# Patient Record
Sex: Female | Born: 1980 | Race: Black or African American | Hispanic: No | Marital: Married | Smoking: Never smoker
Health system: Southern US, Community
[De-identification: ages and names within clinical notes are randomized; demographics above are authoritative.]

## PROBLEM LIST (undated history)

## (undated) ENCOUNTER — Inpatient Hospital Stay (HOSPITAL_COMMUNITY): Payer: Self-pay

## (undated) DIAGNOSIS — Z789 Other specified health status: Secondary | ICD-10-CM

## (undated) HISTORY — PX: NO PAST SURGERIES: SHX2092

---

## 2017-06-09 NOTE — L&D Delivery Note (Signed)
Delivery Note At 8:23 PM, on 05/30/2018, a viable female was delivered via Vaginal, Spontaneous (Presentation: Left Occiput Anterior).  After delivery of head, patient stopped pushing resulting in delay of body delivery.  However, once efforts resumed, shoulders and body delivered easily through nuchal cord via somersault maneuver.  Infant with flaccid tone and weak grimace and cord immediately cut and infant handed off to nurse.  Code Apgar initiated and infant Apgar (6, 9). Placenta delivered spontaneously and care assumed by C. Druscilla BrownieNeill, CNM who completed repair.  Infant weight: 8lbs 6oz, 21in  Anesthesia:  Epidural Episiotomy: None Lacerations:  2nd Degree  Suture Repair: 3.0 Est. Blood Loss (mL):  150mL  Mom to postpartum.  Baby to Couplet care / Skin to Skin.  Cherre RobinsJessica L Quanda Pavlicek MSN, CNM 05/30/2018, 8:41 PM

## 2018-04-08 LAB — OB RESULTS CONSOLE GC/CHLAMYDIA
Chlamydia: NEGATIVE
Gonorrhea: NEGATIVE

## 2018-04-08 LAB — OB RESULTS CONSOLE RUBELLA ANTIBODY, IGM: Rubella: IMMUNE

## 2018-04-08 LAB — OB RESULTS CONSOLE HIV ANTIBODY (ROUTINE TESTING): HIV: NONREACTIVE

## 2018-04-08 LAB — OB RESULTS CONSOLE HEPATITIS B SURFACE ANTIGEN: Hepatitis B Surface Ag: NEGATIVE

## 2018-04-08 LAB — OB RESULTS CONSOLE ABO/RH: RH Type: POSITIVE

## 2018-04-08 LAB — OB RESULTS CONSOLE RPR: RPR: NONREACTIVE

## 2018-04-08 LAB — OB RESULTS CONSOLE ANTIBODY SCREEN: Antibody Screen: NEGATIVE

## 2018-05-03 LAB — OB RESULTS CONSOLE GC/CHLAMYDIA
Chlamydia: NEGATIVE
Gonorrhea: NEGATIVE

## 2018-05-05 LAB — OB RESULTS CONSOLE GBS: GBS: NEGATIVE

## 2018-05-10 ENCOUNTER — Inpatient Hospital Stay (HOSPITAL_COMMUNITY)
Admission: AD | Admit: 2018-05-10 | Discharge: 2018-05-10 | Disposition: A | Discharge status: Home / Self Care | Payer: Self-pay | Source: Ambulatory Visit | Attending: Obstetrics and Gynecology | Admitting: Obstetrics and Gynecology

## 2018-05-10 ENCOUNTER — Encounter (HOSPITAL_COMMUNITY): Payer: Self-pay | Admitting: *Deleted

## 2018-05-10 DIAGNOSIS — O26893 Other specified pregnancy related conditions, third trimester: Secondary | ICD-10-CM | POA: Insufficient documentation

## 2018-05-10 DIAGNOSIS — E860 Dehydration: Secondary | ICD-10-CM | POA: Insufficient documentation

## 2018-05-10 DIAGNOSIS — O471 False labor at or after 37 completed weeks of gestation: Secondary | ICD-10-CM | POA: Insufficient documentation

## 2018-05-10 DIAGNOSIS — Z3A38 38 weeks gestation of pregnancy: Secondary | ICD-10-CM | POA: Insufficient documentation

## 2018-05-10 HISTORY — DX: Other specified health status: Z78.9

## 2018-05-10 LAB — URINALYSIS, ROUTINE W REFLEX MICROSCOPIC
Bacteria, UA: NONE SEEN
Glucose, UA: NEGATIVE mg/dL
Hgb urine dipstick: NEGATIVE
Ketones, ur: 80 mg/dL — AB
Leukocytes, UA: NEGATIVE
Nitrite: NEGATIVE
Protein, ur: 100 mg/dL — AB
Specific Gravity, Urine: 1.027 (ref 1.005–1.030)
pH: 5 (ref 5.0–8.0)

## 2018-05-10 MED ORDER — ONDANSETRON 4 MG PO TBDP
4.0000 mg | ORAL_TABLET | Freq: Once | ORAL | Status: AC
  Administered 2018-05-10: 4 mg via ORAL
  Filled 2018-05-10: qty 1

## 2018-05-10 MED ORDER — ONDANSETRON HCL 4 MG PO TABS: 4.0000 mg | ORAL_TABLET | Freq: Three times a day (TID) | ORAL | 0 refills | Status: DC | PRN

## 2018-05-10 NOTE — MAU Note (Signed)
I have communicated with Dr. Aneta MinsPhillip & Dr. Gerri LinsPeiffer and reviewed vital signs:  Vitals:   05/10/18 1105 05/10/18 1523  BP: 114/83 116/71  Pulse: 92 (!) 102  Resp:    Temp:      Vaginal exam:  Dilation: 3 Effacement (%): 40 Cervical Position: (Left) Station: -2 Presentation: Vertex Exam by:: Ginnie Smartachel Dannilynn Gallina RN,   Also reviewed contraction pattern and that non-stress test is reactive.  It has been documented that patient is contracting every 3-5 minutes with minimal cervical change over 3 hours not indicating active labor.  Patient denies any other complaints.  Based on this report provider has given order for discharge.  A discharge order and diagnosis entered by a provider.   Labor discharge instructions reviewed with patient.     Pt also given instructions that if her n/v doesn't improve with the Zofran to return for IV fluids.

## 2018-05-10 NOTE — MAU Note (Signed)
Pt has been having ctx every 3-5 min. +FM, no LOF or bleeding.

## 2018-05-25 ENCOUNTER — Telehealth (HOSPITAL_COMMUNITY): Payer: Self-pay | Admitting: *Deleted

## 2018-05-25 NOTE — Telephone Encounter (Signed)
Preadmission screen  

## 2018-05-26 ENCOUNTER — Other Ambulatory Visit: Payer: Self-pay | Admitting: Advanced Practice Midwife

## 2018-05-30 ENCOUNTER — Inpatient Hospital Stay (HOSPITAL_COMMUNITY)
Admission: RE | Admit: 2018-05-30 | Discharge: 2018-06-01 | DRG: 807 | Disposition: A | Discharge status: Home / Self Care | Payer: Self-pay | Attending: Obstetrics & Gynecology | Admitting: Obstetrics & Gynecology

## 2018-05-30 ENCOUNTER — Encounter (HOSPITAL_COMMUNITY): Payer: Self-pay

## 2018-05-30 ENCOUNTER — Other Ambulatory Visit: Payer: Self-pay

## 2018-05-30 DIAGNOSIS — Z789 Other specified health status: Secondary | ICD-10-CM | POA: Diagnosis present

## 2018-05-30 DIAGNOSIS — O480 Post-term pregnancy: Principal | ICD-10-CM | POA: Diagnosis present

## 2018-05-30 DIAGNOSIS — Z3A40 40 weeks gestation of pregnancy: Secondary | ICD-10-CM

## 2018-05-30 DIAGNOSIS — Z2839 Other underimmunization status: Secondary | ICD-10-CM

## 2018-05-30 DIAGNOSIS — O09899 Supervision of other high risk pregnancies, unspecified trimester: Secondary | ICD-10-CM

## 2018-05-30 DIAGNOSIS — Z283 Underimmunization status: Secondary | ICD-10-CM

## 2018-05-30 DIAGNOSIS — O403XX0 Polyhydramnios, third trimester, not applicable or unspecified: Secondary | ICD-10-CM | POA: Diagnosis present

## 2018-05-30 DIAGNOSIS — O0930 Supervision of pregnancy with insufficient antenatal care, unspecified trimester: Secondary | ICD-10-CM

## 2018-05-30 DIAGNOSIS — Z3490 Encounter for supervision of normal pregnancy, unspecified, unspecified trimester: Secondary | ICD-10-CM | POA: Diagnosis present

## 2018-05-30 LAB — CBC
HCT: 33.7 % — ABNORMAL LOW (ref 36.0–46.0)
Hemoglobin: 11.3 g/dL — ABNORMAL LOW (ref 12.0–15.0)
MCH: 33.1 pg (ref 26.0–34.0)
MCHC: 33.5 g/dL (ref 30.0–36.0)
MCV: 98.8 fL (ref 80.0–100.0)
Platelets: 144 10*3/uL — ABNORMAL LOW (ref 150–400)
RBC: 3.41 MIL/uL — ABNORMAL LOW (ref 3.87–5.11)
RDW: 12.4 % (ref 11.5–15.5)
WBC: 4 10*3/uL (ref 4.0–10.5)
nRBC: 0 % (ref 0.0–0.2)

## 2018-05-30 LAB — COMPREHENSIVE METABOLIC PANEL
ALK PHOS: 116 U/L (ref 38–126)
ALT: 9 U/L (ref 0–44)
AST: 17 U/L (ref 15–41)
Albumin: 2.9 g/dL — ABNORMAL LOW (ref 3.5–5.0)
Anion gap: 10 (ref 5–15)
BUN: 8 mg/dL (ref 6–20)
CALCIUM: 8.8 mg/dL — AB (ref 8.9–10.3)
CO2: 18 mmol/L — ABNORMAL LOW (ref 22–32)
Chloride: 109 mmol/L (ref 98–111)
Creatinine, Ser: 0.6 mg/dL (ref 0.44–1.00)
GFR calc Af Amer: >60 mL/min (ref >60)
GFR calc non Af Amer: >60 mL/min (ref >60)
Glucose, Bld: 99 mg/dL (ref 70–99)
Potassium: 3.4 mmol/L — ABNORMAL LOW (ref 3.5–5.1)
Sodium: 137 mmol/L (ref 135–145)
Total Bilirubin: 1.8 mg/dL — ABNORMAL HIGH (ref 0.3–1.2)
Total Protein: 6.1 g/dL — ABNORMAL LOW (ref 6.5–8.1)

## 2018-05-30 LAB — ABO/RH: ABO/RH(D): B POS

## 2018-05-30 LAB — TYPE AND SCREEN
ABO/RH(D): B POS
Antibody Screen: NEGATIVE

## 2018-05-30 MED ORDER — LACTATED RINGERS IV SOLN: 500.0000 mL | INTRAVENOUS | Status: DC | PRN

## 2018-05-30 MED ORDER — OXYTOCIN BOLUS FROM INFUSION
500.0000 mL | Freq: Once | INTRAVENOUS | Status: AC
  Administered 2018-05-30: 500 mL via INTRAVENOUS

## 2018-05-30 MED ORDER — OXYCODONE-ACETAMINOPHEN 5-325 MG PO TABS: 1.0000 | ORAL_TABLET | ORAL | Status: DC | PRN

## 2018-05-30 MED ORDER — TERBUTALINE SULFATE 1 MG/ML IJ SOLN
0.2500 mg | Freq: Once | INTRAMUSCULAR | Status: DC | PRN
  Filled 2018-05-30: qty 1

## 2018-05-30 MED ORDER — SOD CITRATE-CITRIC ACID 500-334 MG/5ML PO SOLN: 30.0000 mL | ORAL | Status: DC | PRN

## 2018-05-30 MED ORDER — VITAMIN K1 1 MG/0.5ML IJ SOLN
INTRAMUSCULAR | Status: AC
  Filled 2018-05-30: qty 0.5

## 2018-05-30 MED ORDER — OXYTOCIN 40 UNITS IN LACTATED RINGERS INFUSION - SIMPLE MED
1.0000 m[IU]/min | INTRAVENOUS | Status: DC
  Administered 2018-05-30: 2 m[IU]/min via INTRAVENOUS
  Filled 2018-05-30: qty 1000

## 2018-05-30 MED ORDER — LABETALOL HCL 5 MG/ML IV SOLN: 40.0000 mg | INTRAVENOUS | Status: DC | PRN

## 2018-05-30 MED ORDER — HYDRALAZINE HCL 20 MG/ML IJ SOLN: 10.0000 mg | INTRAMUSCULAR | Status: DC | PRN

## 2018-05-30 MED ORDER — LABETALOL HCL 5 MG/ML IV SOLN: 20.0000 mg | INTRAVENOUS | Status: DC | PRN

## 2018-05-30 MED ORDER — ACETAMINOPHEN 325 MG PO TABS: 650.0000 mg | ORAL_TABLET | ORAL | Status: DC | PRN

## 2018-05-30 MED ORDER — FENTANYL CITRATE (PF) 100 MCG/2ML IJ SOLN: 100.0000 ug | INTRAMUSCULAR | Status: DC | PRN

## 2018-05-30 MED ORDER — ONDANSETRON HCL 4 MG/2ML IJ SOLN: 4.0000 mg | Freq: Four times a day (QID) | INTRAMUSCULAR | Status: DC | PRN

## 2018-05-30 MED ORDER — LACTATED RINGERS IV SOLN
INTRAVENOUS | Status: DC
  Administered 2018-05-30 (×2): via INTRAVENOUS

## 2018-05-30 MED ORDER — OXYTOCIN 40 UNITS IN LACTATED RINGERS INFUSION - SIMPLE MED: 2.5000 [IU]/h | INTRAVENOUS | Status: DC

## 2018-05-30 MED ORDER — OXYCODONE-ACETAMINOPHEN 5-325 MG PO TABS: 2.0000 | ORAL_TABLET | ORAL | Status: DC | PRN

## 2018-05-30 MED ORDER — LIDOCAINE HCL (PF) 1 % IJ SOLN
30.0000 mL | INTRAMUSCULAR | Status: DC | PRN
  Administered 2018-05-30: 30 mL via SUBCUTANEOUS
  Filled 2018-05-30: qty 30

## 2018-05-30 MED ORDER — LABETALOL HCL 5 MG/ML IV SOLN: 80.0000 mg | INTRAVENOUS | Status: DC | PRN

## 2018-05-30 NOTE — H&P (Signed)
Beth Gill is a 37 y.o. female, [id number] at 40.6 weeks, presenting for Postdates IOL. Patient pregnancy and medical history significant for Language Barrier ([place]), Incompetent cervix (Delivery at 16wks), Mild Polyhydramnios, Insufficient PNC, Varicella NI, and Recent immigration from [place] (October 2019). Patient is anticipating the delivery of a female child and plans to breastfeed.  She is unsure of birth control methods after delivery.    Patient Active Problem List   Diagnosis Date Noted  . Post term pregnancy over 40 weeks 05/30/2018    History of present pregnancy: Patient entered care at 32.4 weeks.   EDC of 05/24/2018 was established by 33wk US on Apr 02, 2018.   Anatomy scan:  33 weeks, with normal findings and an anterior placenta.   Additional US evaluations:  *Limited RVOT views that were found to be normal on subsequent studies.  *Mild Polyhydramnios at 36.5wks with AFI of 21cm  OB History    Gravida  4   Para  2   Term  2   Preterm      AB  1   Living  2     SAB  1   TAB      Ectopic      Multiple      Live Births  2          Past Medical History:  Diagnosis Date  . Medical history non-contributory    Past Surgical History:  Procedure Laterality Date  . NO PAST SURGERIES     Family History: family history is not on file. Social History:  reports that she has never smoked. She has never used smokeless tobacco. No history on file for alcohol and drug.   Prenatal Transfer Tool  Maternal Diabetes: No Genetic Screening:None d/t Late PNC Maternal Ultrasounds/Referrals: Normal Fetal Ultrasounds or other Referrals:  None Maternal Substance Abuse:  No Significant Maternal Medications:  Meds include: Other:  Significant Maternal Lab Results: Lab values include: Group B Strep negative  ROS:  +Contractions, -LOF, -VB No N/V or recent illness/fever. +Constipation -All other systems negative upon review.   No Known  Allergies  Fetal Exam: Presentation: Cephalic by BS US  FHR: 130 bpm, Mod Var, -Decels, +Accels UCs:  Q1-115min, palpates moderate  Dilation: 3 Effacement (%): 70 Station: Ballotable Exam by:: Boneta LucksJenny, RN Blood pressure 125/85, pulse 83, resp. rate 16, height 5\' 5"  (1.651 m), weight 74.5 kg, SpO2 99 %.    Physical Exam  Constitutional: She is oriented to person, place, and time. She appears well-developed and well-nourished. No distress.  HENT:  Head: Normocephalic and atraumatic.  Eyes: Pupils are equal, round, and reactive to light.  Neck: Normal range of motion.  Cardiovascular: Normal rate, regular rhythm and normal heart sounds.  Respiratory: Effort normal and breath sounds normal.  GI: Soft.  Appears AGA   Genitourinary:    Genitourinary Comments: VE Deferred by provider.    Musculoskeletal: Normal range of motion.        General: No edema.  Neurological: She is alert and oriented to person, place, and time.  Skin: Skin is warm and dry.  Psychiatric: She has a normal mood and affect. Her behavior is normal.     Prenatal labs: ABO, Rh: --/--/B POS (12/22 0912) Antibody: PENDING (12/22 0912) Rubella:  Immune RPR: Nonreactive (10/31 0000)  HBsAg: Negative (10/31 0000)  HIV: Non-reactive (10/31 0000)  GBS: Negative (11/27 0000) Sickle cell/Hgb electrophoresis:  Normal Pap:  Normal 04/08/2018 GC:  Normal Chlamydia:  Normal  Other:  Varicella Non-Immune    Assessment IUP at 40.6wks Cat I FT PostDates-IOL GBS Negative Language Barrier    Plan: Routine Induction of Labor Orders per Protocol In room to complete assessment and discuss POC: -Discussed induction methods including cervical ripening agents, foley bulbs, and pitocin -Patient verbalizes understanding and wishes to proceed with induction process. -Will start with pitocin as ordered -Interpretations completed with the assistance of family member as language line via I-Pad not functioning.   Joellyn QuailsJessica  L EmlyCNM, MSN 05/30/2018, 10:36 AM

## 2018-05-30 NOTE — Discharge Summary (Addendum)
Postpartum Discharge Summary     Patient Name: Beth Gill DOB: 08-Oct-1980 MRN: [id number]  Date of admission: 05/30/2018 Delivering Provider: Gerrit HeckEMLY, Bartosz Luginbill   Date of discharge: 06/01/2018  Admitting diagnosis: 40 WKS INDUCTION Intrauterine pregnancy: 5346w6d     Secondary diagnosis:  Principal Problem:   Encounter for induction of labor Active Problems:   Post term pregnancy over 40 weeks   Language barrier   Maternal varicella, non-immune   Insufficient prenatal care   SVD (spontaneous vaginal delivery)   Second degree laceration of perineum, delivered, current hospitalization  Additional problems: None     Discharge diagnosis: Term Pregnancy Delivered                                                                                                Post partum procedures:None   Induction: AROM and Pitocin  Complications: None  Hospital course:  Induction of Labor With Vaginal Delivery   37 y.o. yo [id number] at 6546w6d was admitted to the hospital 05/30/2018 for induction of labor.  Indication for induction: Postdates.  Patient had an uncomplicated labor course as follows: Membrane Rupture Time/Date: 5:14 PM ,05/30/2018   Intrapartum Procedures: Episiotomy: None [1]                                         Lacerations:  2nd degree [3]  Patient had delivery of a Viable infant.  Information for the patient's newborn:  Edward JollyRegtoumda, Boy Rilea [[id number]][[id number]]  Delivery Method: Vaginal, Spontaneous(Filed from Delivery Summary)   05/30/2018  Details of delivery can be found in separate delivery note.  Patient had a routine postpartum course. Patient is discharged home 06/01/18.  Magnesium Sulfate recieved: No BMZ received: No  Physical exam  Vitals:   05/31/18 0804 05/31/18 1300 05/31/18 1402 06/01/18 0616  BP: 124/73 117/74 131/84 137/87  Pulse: 69 71 69 63  Resp: 18 20 18 18   Temp: 98.4 F (36.9 C) 98.7 F (37.1 C) 98.3 F (36.8 C) 98.5 F (36.9 C)  TempSrc: Oral    Oral  SpO2: 94% 100%    Weight:      Height:       General: alert, cooperative and no distress  Chest: Lungs CTA, Heart RRR Abdomen: Soft, Appropriately Tender, Non/Distended Breast: Soft, Lactating, Nipples Intact; Assisted with infant latch Lochia: appropriate Uterine Fundus: firm at umbilicus Incision: N/A DVT Evaluation: No evidence of DVT seen on physical exam. No significant calf/ankle edema. Labs: Lab Results  Component Value Date   WBC 4.0 05/30/2018   HGB 11.3 (L) 05/30/2018   HCT 33.7 (L) 05/30/2018   MCV 98.8 05/30/2018   PLT 144 (L) 05/30/2018   No flowsheet data found.  Discharge instruction: per After Visit Summary and "Baby and Me Booklet". Pain Management, Peri-Care, Breastfeeding, Who and When to call for postpartum complications. Information Sheet(s) given Circumcision    After visit meds:  Allergies as of 06/01/2018   No Known Allergies     Medication List    TAKE  these medications   ibuprofen 600 MG tablet Commonly known as:  ADVIL,MOTRIN Take 1 tablet (600 mg total) by mouth every 6 (six) hours as needed for mild pain or moderate pain.       Diet: routine diet  Activity: Advance as tolerated. Pelvic rest for 6 weeks.   Outpatient follow up:4 weeks Follow up Appt:No future appointments. Follow up Visit:   Please schedule this patient for Postpartum visit in: 4 weeks with the following provider: [facility] For [facility] patients schedule nurse incision check in weeks 2 weeks: N/A Low risk pregnancy complicated by: Insufficient PNC, H/O Incompetent Cervix Delivery mode:  SVD Anticipated Birth Control:  Condoms PP Procedures needed: None  Schedule Integrated BH visit: no      Newborn Data: Live born female-Abdoul Hamid Birth Weight: 8lbs 6oz   APGAR:6 ,9   Newborn Delivery   Birth date/time:  05/30/2018 20:23:00 Delivery type:  Vaginal, Spontaneous     Baby Feeding: Breast Disposition:home with  mother   06/01/2018 Cherre RobinsJessica L Phinehas Grounds, CNM

## 2018-05-30 NOTE — Progress Notes (Signed)
Beth Gill MRN: [id number]  Subjective: -In room to assess.  Patient resting in bed and reports that she is coping well with contractions.    Objective: BP 133/84   Pulse 65   Temp 98.5 F (36.9 C) (Oral)   Resp 16   Ht 5\' 5"  (1.651 m)   Wt 74.5 kg   SpO2 99%   BMI 27.32 kg/m  No intake/output data recorded. No intake/output data recorded.  Fetal Monitoring: FHT: 135 bpm, Mod Var, -Decels, +Accels UC:  Q1-555min, palpates moderate to strong   Vaginal Exam: SVE:   Dilation: 5 Effacement (%): 70 Station: 0 Exam by:: Beth Gill,CNM Membranes:AROM w/MSF Noted Internal Monitors: None  Augmentation/Induction: Pitocin:7614mUn/min Cytotec: None  Assessment:  IUP at 40.6wks Cat I FT  Amniotomy IOL-PD   Plan: -Discussed AROM r/b including increased risk of infection, cord prolapse, fetal intolerance, and decreased labor time. No questions or concerns and patient desires to proceed with AROM.  -Will continue present management. -Anticipating SVD   Valma CavaJessica L Isak Sotomayor,MSN, CNM 05/30/2018, 5:34 PM

## 2018-05-30 NOTE — Progress Notes (Signed)
Beth Gill MRN: [id number]  Subjective: -In room to assess.  Patient sleeping and provider did not disturb.  Sister remains at bedside.   Objective: BP 118/72   Pulse 76   Temp 98.8 F (37.1 C) (Oral)   Resp 16   Ht 5\' 5"  (1.651 m)   Wt 74.5 kg   SpO2 99%   BMI 27.32 kg/m  No intake/output data recorded. No intake/output data recorded.  Fetal Monitoring: FHT: 135 bpm, Mod Var, -Decels, +Accels UC:  Q1-95min, palpates moderate to strong   Vaginal Exam: SVE:  Deferred Membranes:Intact Internal Monitors: None  Augmentation/Induction: Pitocin: 5510mUn/min Cytotec: None  Assessment:  IUP at 40.6wks Cat I FT  PD IOL   Plan: -Continue pitocin titration as appropriate. -Will return to assess patient comfort at later time.  -Continue other mgmt as ordered.   Beth CopaJessica L Kortlynn Poust,MSN, CNM 05/30/2018, 1:31 PM

## 2018-05-31 LAB — RPR: RPR Ser Ql: NONREACTIVE

## 2018-05-31 MED ORDER — BENZOCAINE-MENTHOL 20-0.5 % EX AERO
1.0000 "application " | INHALATION_SPRAY | CUTANEOUS | Status: DC | PRN
  Administered 2018-05-31: 1 via TOPICAL
  Filled 2018-05-31: qty 56

## 2018-05-31 MED ORDER — ONDANSETRON HCL 4 MG PO TABS: 4.0000 mg | ORAL_TABLET | ORAL | Status: DC | PRN

## 2018-05-31 MED ORDER — SIMETHICONE 80 MG PO CHEW: 80.0000 mg | CHEWABLE_TABLET | ORAL | Status: DC | PRN

## 2018-05-31 MED ORDER — TETANUS-DIPHTH-ACELL PERTUSSIS 5-2.5-18.5 LF-MCG/0.5 IM SUSP: 0.5000 mL | Freq: Once | INTRAMUSCULAR | Status: AC

## 2018-05-31 MED ORDER — COCONUT OIL OIL: 1.0000 "application " | TOPICAL_OIL | Status: DC | PRN

## 2018-05-31 MED ORDER — ACETAMINOPHEN 325 MG PO TABS
650.0000 mg | ORAL_TABLET | ORAL | Status: DC | PRN
  Administered 2018-05-31 – 2018-06-01 (×3): 650 mg via ORAL
  Filled 2018-05-31 (×3): qty 2

## 2018-05-31 MED ORDER — SENNOSIDES-DOCUSATE SODIUM 8.6-50 MG PO TABS
2.0000 | ORAL_TABLET | ORAL | Status: DC
  Administered 2018-05-31 (×2): 2 via ORAL
  Filled 2018-05-31 (×2): qty 2

## 2018-05-31 MED ORDER — WITCH HAZEL-GLYCERIN EX PADS: 1.0000 "application " | MEDICATED_PAD | CUTANEOUS | Status: DC | PRN

## 2018-05-31 MED ORDER — DIPHENHYDRAMINE HCL 25 MG PO CAPS: 25.0000 mg | ORAL_CAPSULE | Freq: Four times a day (QID) | ORAL | Status: DC | PRN

## 2018-05-31 MED ORDER — ONDANSETRON HCL 4 MG/2ML IJ SOLN: 4.0000 mg | INTRAMUSCULAR | Status: DC | PRN

## 2018-05-31 MED ORDER — PRENATAL MULTIVITAMIN CH
1.0000 | ORAL_TABLET | Freq: Every day | ORAL | Status: DC
  Administered 2018-05-31 – 2018-06-01 (×2): 1 via ORAL
  Filled 2018-05-31 (×2): qty 1

## 2018-05-31 MED ORDER — ZOLPIDEM TARTRATE 5 MG PO TABS: 5.0000 mg | ORAL_TABLET | Freq: Every evening | ORAL | Status: DC | PRN

## 2018-05-31 MED ORDER — IBUPROFEN 600 MG PO TABS
600.0000 mg | ORAL_TABLET | Freq: Four times a day (QID) | ORAL | Status: DC
  Administered 2018-05-31 – 2018-06-01 (×7): 600 mg via ORAL
  Filled 2018-05-31 (×6): qty 1

## 2018-05-31 MED ORDER — DIBUCAINE 1 % RE OINT: 1.0000 "application " | TOPICAL_OINTMENT | RECTAL | Status: DC | PRN

## 2018-05-31 NOTE — Progress Notes (Signed)
POSTPARTUM PROGRESS NOTE  Subjective: Phone interpreter was used for postpartum rounds.  Beth Gill is a 37 y.o. [id number] 7970w6d s/p SVD.  No acute events overnight.  Pt denies problems with ambulating, voiding or po intake.  She denies nausea or vomiting.  Pain is well controlled.  She has had flatus. She has not had bowel movement.  Lochia Moderate.   Objective: Blood pressure 124/73, pulse 69, temperature 98.4 F (36.9 C), temperature source Oral, resp. rate 18, height 5\' 5"  (1.651 m), weight 74.5 kg, SpO2 94 %, unknown if currently breastfeeding.  Physical Exam:  General: alert, sitting in bed, NAD Lochia:normal flow Chest: no respiratory distress Heart:regular rate, distal pulses intact Incision: NA  Abdomen: soft, nontender,  Uterine Fundus: firm, appropriately tender DVT Evaluation: No calf swelling or tenderness Extremities: no pedal edema  Recent Labs    05/30/18 0912  HGB 11.3*  HCT 33.7*    Assessment/Plan:  ASSESSMENT: Beth Gill is a 37 y.o. [id number] 1570w6d s/p SVD. -doing well today, no concerns -breastfeeding, denies difficulty or questions at this time -did have some elevated pressures during delivery, PIH labs negative. BP wnl today -RPR pending -unsure about postpartum contraception at this time  Plan for discharge tomorrow   LOS: 1 day   Jayelyn Barno H WilsonMD 05/31/2018, 9:17 AM

## 2018-05-31 NOTE — Lactation Note (Signed)
This note was copied from a baby's chart. Lactation Consultation Note  Patient Name: Boy Gloris HamRoukieta Palladino XBJYN'WToday's Date: 05/31/2018 Reason for consult: Initial assessment;Term Video interpreter used for visit.  Breastfeeding consultation services and support information given to patient.  Mom is experienced breastfeeding her previous babies for 2 years.  She chooses to breastfeed and formula feed.  Discussed importance of putting baby to breast first with any feeding cues.  Mom reports that baby is latching without difficulty.  Questions answered. Encouraged to call for assist/concerns prn.  Maternal Data Does the patient have breastfeeding experience prior to this delivery?: Yes  Feeding Feeding Type: Breast Fed  LATCH Score Latch: Repeated attempts needed to sustain latch, nipple held in mouth throughout feeding, stimulation needed to elicit sucking reflex.  Audible Swallowing: A few with stimulation  Type of Nipple: Everted at rest and after stimulation  Comfort (Breast/Nipple): Soft / non-tender  Hold (Positioning): No assistance needed to correctly position infant at breast.  LATCH Score: 8  Interventions    Lactation Tools Discussed/Used     Consult Status Consult Status: Follow-up Date: 06/01/18 Follow-up type: In-patient    Huston FoleyMOULDEN, Jesenya Bowditch S 05/31/2018, 10:53 AM

## 2018-05-31 NOTE — Progress Notes (Signed)
Reviewed baby safety sheet, and safe sleep information with MOB using Interpreter [phone](#[phone]). During this time patient requested to give baby formula via bottle, proceeded to do LEAD education with pt via Interpreter.        Parent request formula to supplement breast feeding due to mother's choice, she stated she wanted to be breast bottle during admission. Parents have been informed of small tummy size of newborn, taught hand expression and understands the possible consequences of formula to the health of the infant. The possible consequences shared with patent include 1) Loss of confidence in breastfeeding 2) Engorgement 3) Allergic sensitization of baby(asthema/allergies) and 4) decreased milk supply for mother. After discussion of the above the mother decided to breast feed and supplement with formula. The  tool used to give formula supplement will be bottle.

## 2018-06-01 MED ORDER — IBUPROFEN 600 MG PO TABS: 600.0000 mg | ORAL_TABLET | Freq: Four times a day (QID) | ORAL | 1 refills | Status: DC | PRN

## 2018-06-01 NOTE — Lactation Note (Addendum)
This note was copied from a baby's chart. Lactation Consultation Note  Patient Name: Boy Gloris HamRoukieta Yellin BJYNW'GToday's Date: 06/01/2018   P3, Baby 36 hours old.  Video interpreter used for [place] [phone]. Mother is breastfeeding and formula feeding. Encouraged breastfeeding before offering formula to help her establish her milk supply. Discussed supply and demand. Feed on demand approximately 8-12 times per day.   Reviewed engorgement care and monitoring voids/stools.      Maternal Data    Feeding Feeding Type: Bottle Fed - Formula Nipple Type: Slow - flow  LATCH Score                   Interventions    Lactation Tools Discussed/Used     Consult Status      Hardie PulleyBerkelhammer, Ruth Boschen 06/01/2018, 9:11 AM

## 2018-06-01 NOTE — Progress Notes (Signed)
CLINICAL SOCIAL WORK MATERNAL/CHILD NOTE  Patient Details  Name: Beth Gill MRN: [id number] Date of Birth: 05/30/2016  Date:  06/01/2018  Clinical Social Worker Initiating Note:  Abundio Miu, [place]   Date/Time: Initiated:  06/01/18/1114             Child's Name:  Beth Gill   Biological Parents:  Mother, Father(Father - Faustino Congress)   Need for Interpreter:  French(Video Interpreter Hildred Priest [phone])   Reason for Referral:  Late or No Prenatal Care    Address:  [address] [place] [address]    Phone number:  [phone] (home)     Additional phone number:   Household Members/Support Persons (HM/SP):   Household Member/Support Person 1, Household Member/Support Person 2   HM/SP Name Relationship DOB or Age  HM/SP -1  sister   HM/SP -2  brother in Sports coach   HM/SP -3     HM/SP -4     HM/SP -37     HM/SP -6     HM/SP -7     HM/SP -8       Natural Supports (not living in the home): Immediate Family   Professional Supports:    Employment:Unemployed   Type of Work:     Education:  Programmer, systems   Homebound arranged:    Financial Resources:Self-Pay    Other Resources: [facility]   Cultural/Religious Considerations Which May Impact Care:   Strengths: Ability to meet basic needs , Pediatrician chosen   Psychotropic Medications:         Pediatrician:    [place] area  Pediatrician List:   [place] Other([facility] [place])  [place]     Pediatrician Fax Number:    Risk Factors/Current Problems: Other (Comment)(language barrier)   Cognitive State: Able to Concentrate , Alert , Linear Thinking    Mood/Affect: Calm , Relaxed , Interested    CSW Assessment:CSW met with MOB at bedside to discuss late prenatal care. CSW utilized [employer]  interpreting to speak to translate. MOB was welcoming and engaged during assessment. CSW introduced self and explained role. MOB reported that she resides with her sister and brother in law. MOB reported that FOB and her 2 other children (Nouria Kolgol;; [place]) are in [place], noting that they are not planning to move to [place] right now. MOB reported that it is her intention to stay in [place] and reported that she was here in 2014. MOB reported that she is not currently working and that her sister is her support person. MOB reported that her sister is currently at home getting things organized for the baby. CSW inquired if MOB had all needed items for the baby, MOB reported that she will find out when she gets home. CSW asked MOB if she is able to get any items that baby needs if they are not at the home, MOB reported that her sister can get the items. CSW asked MOB if her car seat was new/used and if she checked the expiration. MOB reported that she is unaware because her sister is bringing the car seat. MOB asked if the car seat is expired can the hospital provide a car seat, CSW informed MOB that she could get a one time car seat and that she will have to pay for it. MOB reported that her sister can  pay for it if needed and asked who will she need to ask for it if needed, CSW explained that MOB's RN could help her get the car seat.   CSW inquired about MOB's mental health history, MOB denied any mental health history and any history of postpartum depression with two older children. MOB presented calm and did not demonstrate any acute mental health signs/symptoms. CSW assessed for safety, MOB denied SI, HI and domestic violence.   CSW provided education regarding the baby blues period vs. perinatal mood disorders, discussed treatment and gave resources for mental health follow up if concerns arise.  CSW recommends self-evaluation during the postpartum time period using the New Mom Checklist  from Postpartum Progress and encouraged MOB to contact a medical professional if symptoms are noted at any time.    CSW provided review of Sudden Infant Death Syndrome (SIDS) precautions. MOB reported that infant would sleep with her. CSW informed MOB that co-sleeping was not appropriate and inquired if MOB needed a baby box, MOB replied yes. CSW reiterated that co-sleeping is not appropriate, MOB verbalized understanding. CSW provided MOB with a baby box.   CSW informed MOB about hospital drug policy due to late prenatal care. CSW asked MOB if she received any prenatal care in [place], MOB reported no and did not give a reason why. CSW asked MOB if she used any substances during her pregnancy, MOB denied any substance use during pregnancy. CSW informed MOB that a CPS report would be made if infant tested positive for any illegal substances and or unprescribed substances, MOB verbalized understanding and did not seem concerned.    CSW identifies no further need for intervention and no barriers to discharge at this time.  CSW notified patient's bedside RN that patient may need a car seat.   CSW Plan/Description: No Further Intervention Required/No Barriers to Discharge, Sudden Infant Death Syndrome (SIDS) Education, Perinatal Mood and Anxiety Disorder (PMADs) Education, [place], CSW Will Continue to Monitor Umbilical Cord Tissue Drug Screen Results and Make Report if Barbette Or, LCSW 06/01/2018, 11:20 AM

## 2018-06-07 ENCOUNTER — Inpatient Hospital Stay (HOSPITAL_COMMUNITY)
Admission: AD | Admit: 2018-06-07 | Discharge: 2018-06-07 | Disposition: A | Discharge status: Home / Self Care | Payer: Self-pay | Source: Ambulatory Visit | Attending: Obstetrics & Gynecology | Admitting: Obstetrics & Gynecology

## 2018-06-07 ENCOUNTER — Encounter (HOSPITAL_COMMUNITY): Payer: Self-pay

## 2018-06-07 ENCOUNTER — Inpatient Hospital Stay (HOSPITAL_COMMUNITY): Payer: Self-pay

## 2018-06-07 DIAGNOSIS — O165 Unspecified maternal hypertension, complicating the puerperium: Secondary | ICD-10-CM | POA: Insufficient documentation

## 2018-06-07 DIAGNOSIS — Z789 Other specified health status: Secondary | ICD-10-CM | POA: Diagnosis present

## 2018-06-07 LAB — COMPREHENSIVE METABOLIC PANEL
ALT: 20 U/L (ref 0–44)
ANION GAP: 7 (ref 5–15)
AST: 19 U/L (ref 15–41)
Albumin: 3.3 g/dL — ABNORMAL LOW (ref 3.5–5.0)
Alkaline Phosphatase: 81 U/L (ref 38–126)
BUN: 13 mg/dL (ref 6–20)
CHLORIDE: 109 mmol/L (ref 98–111)
CO2: 22 mmol/L (ref 22–32)
Calcium: 8.4 mg/dL — ABNORMAL LOW (ref 8.9–10.3)
Creatinine, Ser: 0.7 mg/dL (ref 0.44–1.00)
GFR calc Af Amer: >60 mL/min (ref >60)
GFR calc non Af Amer: >60 mL/min (ref >60)
Glucose, Bld: 105 mg/dL — ABNORMAL HIGH (ref 70–99)
POTASSIUM: 3.4 mmol/L — AB (ref 3.5–5.1)
Sodium: 138 mmol/L (ref 135–145)
Total Bilirubin: 1 mg/dL (ref 0.3–1.2)
Total Protein: 6.5 g/dL (ref 6.5–8.1)

## 2018-06-07 LAB — PROTEIN / CREATININE RATIO, URINE
Creatinine, Urine: 347 mg/dL
Protein Creatinine Ratio: 0.12 mg/mg{Cre} (ref 0.00–0.15)
Total Protein, Urine: 41 mg/dL

## 2018-06-07 LAB — CBC
HCT: 34.6 % — ABNORMAL LOW (ref 36.0–46.0)
Hemoglobin: 11.4 g/dL — ABNORMAL LOW (ref 12.0–15.0)
MCH: 32.8 pg (ref 26.0–34.0)
MCHC: 32.9 g/dL (ref 30.0–36.0)
MCV: 99.4 fL (ref 80.0–100.0)
PLATELETS: 258 10*3/uL (ref 150–400)
RBC: 3.48 MIL/uL — ABNORMAL LOW (ref 3.87–5.11)
RDW: 12.3 % (ref 11.5–15.5)
WBC: 5.9 10*3/uL (ref 4.0–10.5)
nRBC: 0 % (ref 0.0–0.2)

## 2018-06-07 MED ORDER — NIFEDIPINE 10 MG PO CAPS: 20.0000 mg | ORAL_CAPSULE | ORAL | Status: DC | PRN

## 2018-06-07 MED ORDER — IBUPROFEN 600 MG PO TABS: 600.0000 mg | ORAL_TABLET | Freq: Four times a day (QID) | ORAL | 1 refills | Status: AC | PRN

## 2018-06-07 MED ORDER — IBUPROFEN 800 MG PO TABS
800.0000 mg | ORAL_TABLET | Freq: Once | ORAL | Status: AC
  Administered 2018-06-07: 800 mg via ORAL
  Filled 2018-06-07: qty 1

## 2018-06-07 MED ORDER — MISOPROSTOL 200 MCG PO TABS
800.0000 ug | ORAL_TABLET | Freq: Once | ORAL | Status: DC
  Filled 2018-06-07: qty 4

## 2018-06-07 MED ORDER — LABETALOL HCL 5 MG/ML IV SOLN: 40.0000 mg | INTRAVENOUS | Status: DC | PRN

## 2018-06-07 MED ORDER — ACETAMINOPHEN 500 MG PO TABS
1000.0000 mg | ORAL_TABLET | Freq: Once | ORAL | Status: AC
  Administered 2018-06-07: 1000 mg via ORAL
  Filled 2018-06-07: qty 2

## 2018-06-07 MED ORDER — NIFEDIPINE 10 MG PO CAPS
10.0000 mg | ORAL_CAPSULE | ORAL | Status: DC | PRN
  Administered 2018-06-07: 10 mg via ORAL
  Filled 2018-06-07: qty 1

## 2018-06-07 MED ORDER — MISOPROSTOL 200 MCG PO TABS
800.0000 ug | ORAL_TABLET | Freq: Once | ORAL | Status: AC
  Administered 2018-06-07: 800 ug via RECTAL

## 2018-06-07 MED ORDER — AMLODIPINE BESYLATE 5 MG PO TABS: 5.0000 mg | ORAL_TABLET | Freq: Every day | ORAL | 0 refills | Status: DC

## 2018-06-07 NOTE — Progress Notes (Signed)
Using interpreter # [id number] Pt s/p SVD 12/22 & states has been having cramping & passing large clots only in the morning.  Pt denies saturating a pad in hr.  Pt states cramping with activity that radiates down left leg as 6 on 0-10 pain scale; making it difficult for her to walk.  Pt states she breast & bottle feeds.  Pt states she took prescribed Motrin this am & that it's helpful, but pain returns when med wears off.  Pt states that she has h/a as 8 on 0-10 pain scale, denies visual disturbances, or epigastric pain. CNM at bedside at this time to listen to intake. PreE assessment nl.  Fundas firm noted 2 FB below umbilicus.  Pt states she changed pad before arrival to MAU & pad noted to have scant amt lochia.  POC discussed with pt re: labs, spec exam, ultrasound, & plan for Bp assessment. Pt verb understanding & has no questions at this time.

## 2018-06-07 NOTE — Discharge Instructions (Signed)
You need to pick up your prescription for Ibuprofen and Norvasc at a pharmacy. Take the Norvasc 5 mg every day. Take Ibuprofen as needed.

## 2018-06-07 NOTE — Progress Notes (Signed)
Interpreter Carney BernJean [phone]#[phone] used to educate on misoprostol, tylenol, Motrin, & procardia; given indication,action & side effects of meds. Pt verb understanding. Miso placed rectally as ordered.

## 2018-06-07 NOTE — MAU Note (Signed)
Urine in the lab  

## 2018-06-07 NOTE — MAU Provider Note (Addendum)
History     CSN: [id number]  Arrival date and time: 06/07/18 1538  Provider's first contact with patient at 1630 -- assessment and exam done with assistance from [employer] video interpreter Roddie McMadeleine [phone]     Chief Complaint  Patient presents with  . Vaginal Bleeding   HPI  Ms.  Beth Gill is a 37 y.o. year old [id number] female at 8 days PP who presents to MAU reporting lower abdominal cramping, VB, passing large blood clots every morning, the abdominal cramping radiates down LT leg, and H/A (rated 8/10). She states that she take Motrin and it takes the pain away, but the pain comes back when the medicine wears off. She does reports pain in the vaginal area where she was stitched up.  Past Medical History:  Diagnosis Date  . Medical history non-contributory     Past Surgical History:  Procedure Laterality Date  . NO PAST SURGERIES      No family history on file.  Social History   Tobacco Use  . Smoking status: Never Smoker  . Smokeless tobacco: Never Used  Substance Use Topics  . Alcohol use: Not Currently    Frequency: Never  . Drug use: Never    Allergies: No Known Allergies  Medications Prior to Admission  Medication Sig Dispense Refill Last Dose  . ibuprofen (ADVIL,MOTRIN) 600 MG tablet Take 1 tablet (600 mg total) by mouth every 6 (six) hours as needed for mild pain or moderate pain. 30 tablet 1     Review of Systems  Constitutional: Negative.   HENT: Negative.   Eyes: Negative.   Respiratory: Negative.   Cardiovascular: Negative.   Gastrointestinal: Negative.   Endocrine: Negative.   Genitourinary: Positive for vaginal bleeding ("passing large clots").  Musculoskeletal: Negative.        LT leg pain  Skin: Negative.   Allergic/Immunologic: Negative.   Neurological: Negative.   Hematological: Negative.   Psychiatric/Behavioral: Negative.    Physical Exam   Blood pressure (!) 173/106, pulse 81, temperature 98.6 F (37 C), temperature  source Oral, resp. rate 16, height 5\' 3"  (1.6 m), weight 60.3 kg, SpO2 100 %.  Physical Exam  Nursing note and vitals reviewed. Constitutional: She is oriented to person, place, and time. She appears well-developed and well-nourished.  HENT:  Head: Normocephalic and atraumatic.  Eyes: Pupils are equal, round, and reactive to light.  Neck: Normal range of motion.  Cardiovascular: Normal rate.  Respiratory: Effort normal.  GI: Soft.  Genitourinary:    Genitourinary Comments: No active bleeding, 2nd degree vaginal repair visualized, sutures intact, normal healing tissue   Musculoskeletal: Normal range of motion.  Neurological: She is alert and oriented to person, place, and time.  Skin: Skin is warm and dry.  Psychiatric: She has a normal mood and affect. Her behavior is normal. Judgment and thought content normal.    MAU Course  Procedures  MDM OB U/S to R/O Retained POC Cytotec 800 mcg rectally  *Consult with Dr. Alysia PennaErvin @ 1803 - notified of patient's complaints, assessments, lab & U/S results, tx plan give Cytotec 800 mcg rectally, recommend to Rx Norvasc 5 mg qd and recheck BP on Friday 06/11/18 - ok to d/c home, agrees with plan   Results for orders placed or performed during the hospital encounter of 06/07/18 (from the past 24 hour(s))  Protein / creatinine ratio, urine     Status: None   Collection Time: 06/07/18  4:29 PM  Result Value Ref Range   Creatinine,  Urine 347.00 mg/dL   Total Protein, Urine 41 mg/dL   Protein Creatinine Ratio 0.12 0.00 - 0.15 mg/mg[Cre]  CBC     Status: Abnormal   Collection Time: 06/07/18  4:52 PM  Result Value Ref Range   WBC 5.9 4.0 - 10.5 K/uL   RBC 3.48 (L) 3.87 - 5.11 MIL/uL   Hemoglobin 11.4 (L) 12.0 - 15.0 g/dL   HCT 14.734.6 (L) 82.936.0 - 56.246.0 %   MCV 99.4 80.0 - 100.0 fL   MCH 32.8 26.0 - 34.0 pg   MCHC 32.9 30.0 - 36.0 g/dL   RDW 13.012.3 86.511.5 - 78.415.5 %   Platelets 258 150 - 400 K/uL   nRBC 0.0 0.0 - 0.2 %  Comprehensive metabolic panel      Status: Abnormal   Collection Time: 06/07/18  4:52 PM  Result Value Ref Range   Sodium 138 135 - 145 mmol/L   Potassium 3.4 (L) 3.5 - 5.1 mmol/L   Chloride 109 98 - 111 mmol/L   CO2 22 22 - 32 mmol/L   Glucose, Bld 105 (H) 70 - 99 mg/dL   BUN 13 6 - 20 mg/dL   Creatinine, Ser 6.960.70 0.44 - 1.00 mg/dL   Calcium 8.4 (L) 8.9 - 10.3 mg/dL   Total Protein 6.5 6.5 - 8.1 g/dL   Albumin 3.3 (L) 3.5 - 5.0 g/dL   AST 19 15 - 41 U/L   ALT 20 0 - 44 U/L   Alkaline Phosphatase 81 38 - 126 U/L   Total Bilirubin 1.0 0.3 - 1.2 mg/dL   GFR calc non Af Amer >60 >60 mL/min   GFR calc Af Amer >60 >60 mL/min   Anion gap 7 5 - 15    [place] Ob Comp Less 14 Wks  Result Date: 06/07/2018 CLINICAL DATA:  Postpartum bleeding EXAM: OBSTETRIC <14 WK ULTRASOUND TECHNIQUE: Transabdominal ultrasound was performed for evaluation of the gestation as well as the maternal uterus and adnexal regions. COMPARISON:  None. FINDINGS: Intrauterine gestational sac: None visualized Yolk sac:  None Embryo:  None Cardiac Activity: Heart Rate:  bpm MSD:    mm    w     d CRL:     mm    w  d                  US EDC: Subchorionic hemorrhage:  None visualized. Maternal uterus/adnexae: Endometrium approximately 15 mm in thickness, heterogeneous. There appears to be some vascularity within the endometrium. Findings concerning for retained products of conception. IMPRESSION: Heterogeneous endometrium with areas of vascularity concerning for retained products of conception. Electronically Signed   By: Charlett NoseKevin  Dover M.D.   On: 06/07/2018 17:44    Assessment and Plan  Retained products of conception, postpartum  - Discussed results of U/S with patient - Advised that having received Cytotec, she should expect to have heavy bleeding, clots and increased pelvic painas what is left inside her uterus is expelled out  Postpartum hypertension - Rx for Norvasc 5 mg po qd - Information provided on postpartum hypertension - [facility]  [facility]11/18 -- [facility] patient to get this scheduled  - Discharge patient - Patient verbalized an understanding of the plan of care and agrees.    *Discharge instructions and all questions answered with assistance from [employer] video interpreter Liliana #[id number]  Raelyn Moraolitta Shanina Kepple, MSN, CNM 06/07/2018, 4:30 PM

## 2018-06-08 ENCOUNTER — Telehealth: Payer: Self-pay | Admitting: Family Medicine

## 2018-06-08 NOTE — Telephone Encounter (Signed)
Left a message for the patient via voicemail. The appointment is made per the request. Sending a letter reminder to the patient for secondary notification.

## 2018-06-11 ENCOUNTER — Ambulatory Visit (INDEPENDENT_AMBULATORY_CARE_PROVIDER_SITE_OTHER): Payer: Self-pay | Admitting: General Practice

## 2018-06-11 VITALS — BP 139/95 | HR 92 | Ht 64.17 in | Wt 130.0 lb

## 2018-06-11 DIAGNOSIS — O165 Unspecified maternal hypertension, complicating the puerperium: Secondary | ICD-10-CM

## 2018-06-11 MED ORDER — AMLODIPINE BESYLATE 10 MG PO TABS: 10.0000 mg | ORAL_TABLET | Freq: Every day | ORAL | 0 refills | Status: AC

## 2018-06-11 NOTE — Patient Instructions (Signed)
Postpartum Hypertension  Postpartum hypertension is high blood pressure that remains higher than normal after childbirth. You may not realize that you have postpartum hypertension if your blood pressure is not being checked regularly. In most cases, postpartum hypertension will go away on its own, usually within a week of delivery. However, for some women, medical treatment is required to prevent serious complications, such as seizures or stroke.  What are the causes?  This condition may be caused by one or more of the following:   Hypertension that existed before pregnancy (chronic hypertension).   Hypertension that comes on as a result of pregnancy (gestational hypertension).   Hypertensive disorders during pregnancy (preeclampsia) or seizures in women who have high blood pressure during pregnancy (eclampsia).   A condition in which the liver, platelets, and red blood cells are damaged during pregnancy (HELLP syndrome).   A condition in which the thyroid produces too much hormones (hyperthyroidism).   Other rare problems of the nerves (neurological disorders) or blood disorders.  In some cases, the cause may not be known.  What increases the risk?  The following factors may make you more likely to develop this condition:   Chronic hypertension. In some cases, this may not have been diagnosed before pregnancy.   Obesity.   Type 2 diabetes.   Kidney disease.   History of preeclampsia or eclampsia.   Other medical conditions that change the level of hormones in the body (hormonal imbalance).  What are the signs or symptoms?  As with all types of hypertension, postpartum hypertension may not have any symptoms. Depending on how high your blood pressure is, you may experience:   Headaches. These may be mild, moderate, or severe. They may also be steady, constant, or sudden in onset (thunderclap headache).   Changes in your ability to see (visual changes).   Dizziness.   Shortness of breath.   Swelling  of your hands, feet, lower legs, or face. In some cases, you may have swelling in more than one of these locations.   Heart palpitations or a racing heartbeat.   Difficulty breathing while lying down.   Decrease in the amount of urine that you pass.  Other rare signs and symptoms may include:   Sweating more than usual. This lasts longer than a few days after delivery.   Chest pain.   Sudden dizziness when you get up from sitting or lying down.   Seizures.   Nausea or vomiting.   Abdominal pain.  How is this diagnosed?  This condition may be diagnosed based on the results of a physical exam, blood pressure measurements, and blood and urine tests.  You may also have other tests, such as a CT scan or an MRI, to check for other problems of postpartum hypertension.  How is this treated?  If blood pressure is high enough to require treatment, your options may include:   Medicines to reduce blood pressure (antihypertensives). Tell your health care provider if you are breastfeeding or if you plan to breastfeed. There are many antihypertensive medicines that are safe to take while breastfeeding.   Stopping medicines that may be causing hypertension.   Treating medical conditions that are causing hypertension.   Treating the complications of hypertension, such as seizures, stroke, or kidney problems.  Your health care provider will also continue to monitor your blood pressure closely until it is within a safe range for you.  Follow these instructions at home:   Take over-the-counter and prescription medicines only as   told by your health care provider.   Return to your normal activities as told by your health care provider. Ask your health care provider what activities are safe for you.   Do not use any products that contain nicotine or tobacco, such as cigarettes and e-cigarettes. If you need help quitting, ask your health care provider.   Keep all follow-up visits as told by your health care provider. This  is important.  Contact a health care provider if:   Your symptoms get worse.   You have new symptoms, such as:  ? A headache that does not get better.  ? Dizziness.  ? Visual changes.  Get help right away if:   You suddenly develop swelling in your hands, ankles, or face.   You have sudden, rapid weight gain.   You develop difficulty breathing, chest pain, racing heartbeat, or heart palpitations.   You develop severe pain in your abdomen.   You have any symptoms of a stroke. "BE FAST" is an easy way to remember the main warning signs of a stroke:  ? B - Balance. Signs are dizziness, sudden trouble walking, or loss of balance.  ? E - Eyes. Signs are trouble seeing or a sudden change in vision.  ? F - Face. Signs are sudden weakness or numbness of the face, or the face or eyelid drooping on one side.  ? A - Arms. Signs are weakness or numbness in an arm. This happens suddenly and usually on one side of the body.  ? S - Speech. Signs are sudden trouble speaking, slurred speech, or trouble understanding what people say.  ? T - Time. Time to call emergency services. Write down what time symptoms started.   You have other signs of a stroke, such as:  ? A sudden, severe headache with no known cause.  ? Nausea or vomiting.  ? Seizure.  These symptoms may represent a serious problem that is an emergency. Do not wait to see if the symptoms will go away. Get medical help right away. Call your local emergency services (911 in the [place] Do not drive yourself to the hospital.  Summary   Postpartum hypertension is high blood pressure that remains higher than normal after childbirth.   In most cases, postpartum hypertension will go away on its own, usually within a week of delivery.   For some women, medical treatment is required to prevent serious complications, such as seizures or stroke.  This information is not intended to replace advice given to you by your health care provider. Make sure you discuss any questions  you have with your health care provider.  Document Released: 01/27/2014 Document Revised: 03/16/2017 Document Reviewed: 03/16/2017  Elsevier Interactive Patient Education  2019 [employer]

## 2018-06-11 NOTE — Progress Notes (Signed)
Patient presents to office today for blood pressure check following recent visit to MAU on 12/30 for retained products & elevated blood pressures. Patient reports taking norvasc daily and reports some headaches as well. Patient voices concern over light bleeding, cramping, small clots & reports a smell like "rotten fish" over the past few days.   Called Dr Nena Alexander concerning patient's symptoms and elevated BP. Dr Macon Large advises increasing Norvasc to 10mg  and states nothing else needs to be done regarding retained products unless bleeding increases to heavy or pain is severe. Discussed all with patient. Patient reports continued concern over smell & bleeding. Judeth Horn in to reassure patient. She will return in 1 week for repeat BP check.   Chase Caller RN BSN 06/11/18

## 2018-06-12 NOTE — Progress Notes (Signed)
Chart reviewed for nurse visit. Agree with plan of care.   Spoke with patient via video [place] interpreter. Discussed recommendations and s/s to return to MAU. Pt reassured and has no further questions.   Judeth Horn, NP 06/12/2018 1:01 PM

## 2018-06-18 ENCOUNTER — Ambulatory Visit: Payer: Self-pay

## 2018-06-22 ENCOUNTER — Ambulatory Visit (INDEPENDENT_AMBULATORY_CARE_PROVIDER_SITE_OTHER): Payer: Self-pay

## 2018-06-22 VITALS — BP 148/107 | HR 74 | Wt 127.3 lb

## 2018-06-22 DIAGNOSIS — Z0130 Encounter for examination of blood pressure without abnormal findings: Secondary | ICD-10-CM

## 2018-06-22 NOTE — Progress Notes (Signed)
Pt here today for BP check s/p increase in Norvasc dosage to 10 mg.  With Video Interpreter # [phone] Pt reports taking one tablet a day in the evening. Due to pt not taking medication until in the evening pt has not yet taking medication today.  Pt reports having headaches that is relieved when she takes Tylenol.  Notified Dr. Rhett Bannister, provider recommendation to have pt come back on Friday for another BP check and makes sure that the pt takes the medication before appt.  Notified pt of provider's recommendation and to start taking in the am.  Pt stated understanding with no further questions.

## 2018-06-23 NOTE — Progress Notes (Signed)
Chart reviewed for nurse visit. Agree with plan of care.   Wallace, Catherine Lauren, DO 06/23/2018 11:35 AM  

## 2018-06-25 ENCOUNTER — Ambulatory Visit: Payer: Self-pay

## 2019-08-01 IMAGING — US US OB COMP LESS 14 WK
1 series · 15 of 28 positions shown · non-contrast
Comparison: None.

CLINICAL DATA: Postpartum bleeding

EXAM:
OBSTETRIC <14 WK ULTRASOUND
TECHNIQUE: Transabdominal ultrasound was performed for evaluation of the
gestation as well as the maternal uterus and adnexal regions.

[Series 1: us ob comp less 14 wk · 36 acquisitions, 15 frames shown]
[im 1/36]
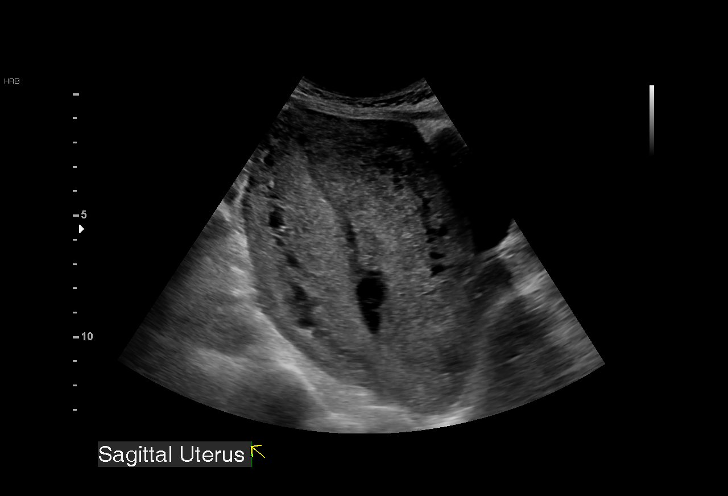
[im 3/36]
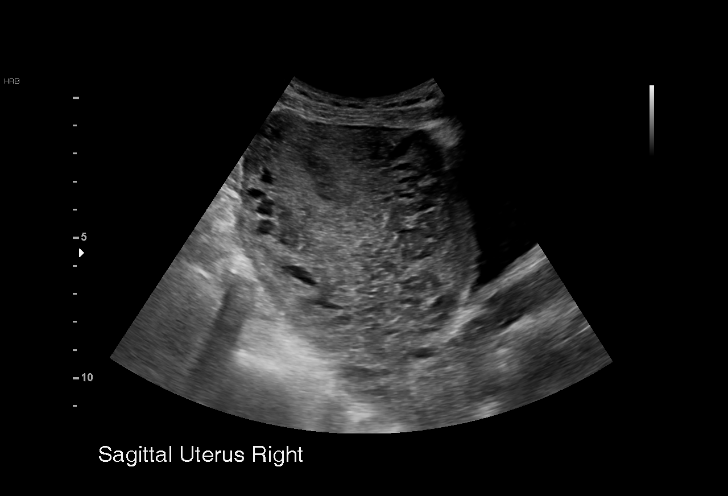
[im 6/36]
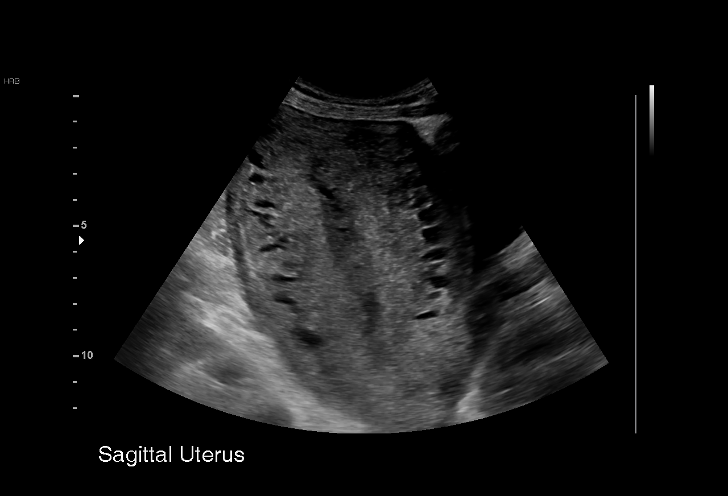
[im 8/36]
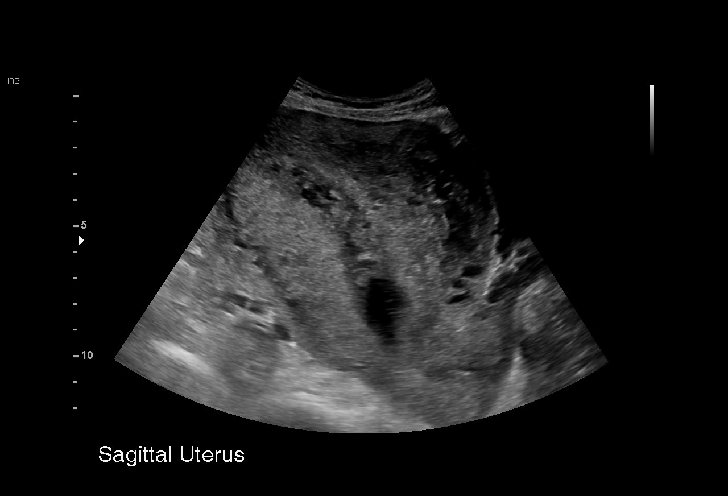
[im 11/36]
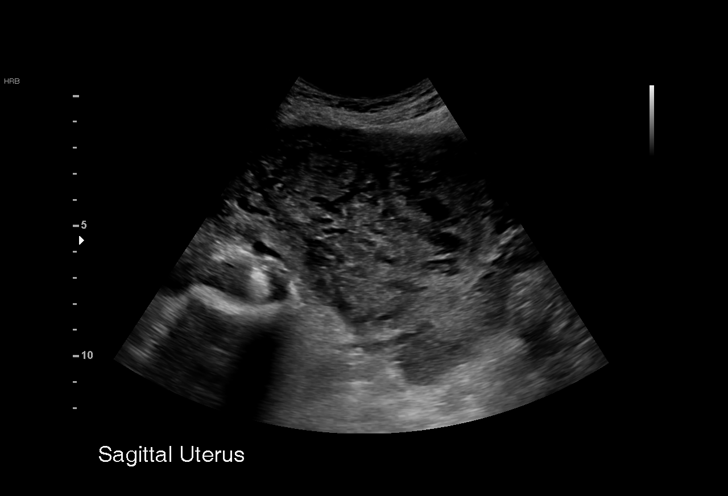
[im 13/36]
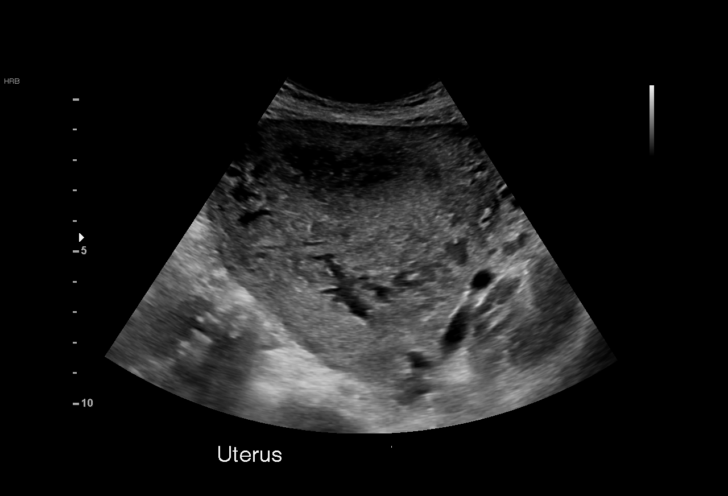
[im 16/36]
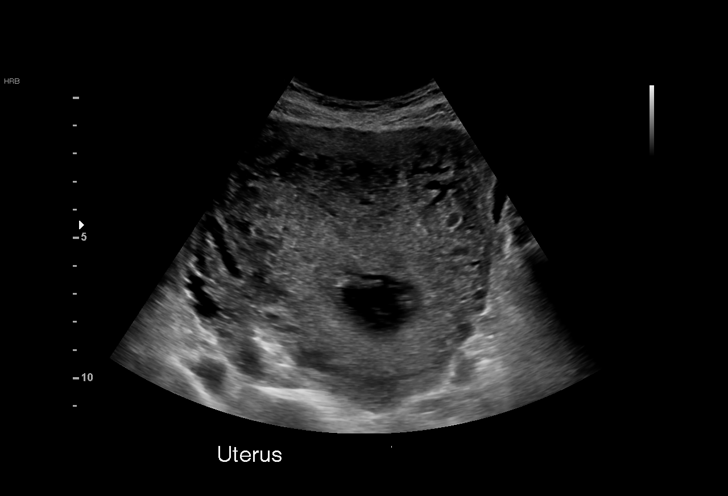
[im 19/36]
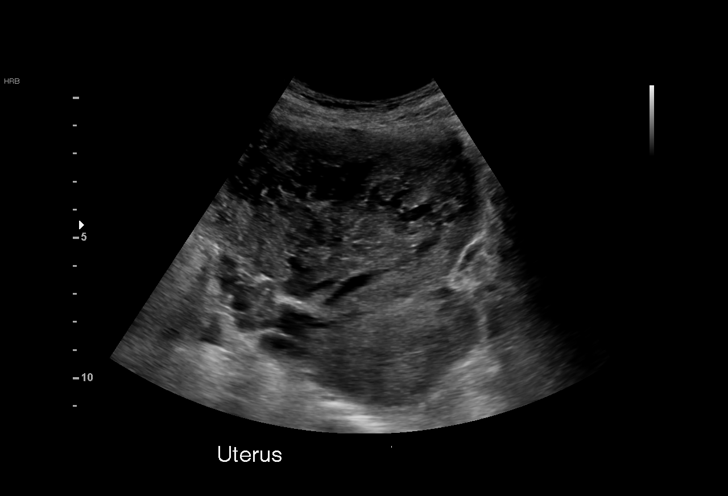
[im 20/36]
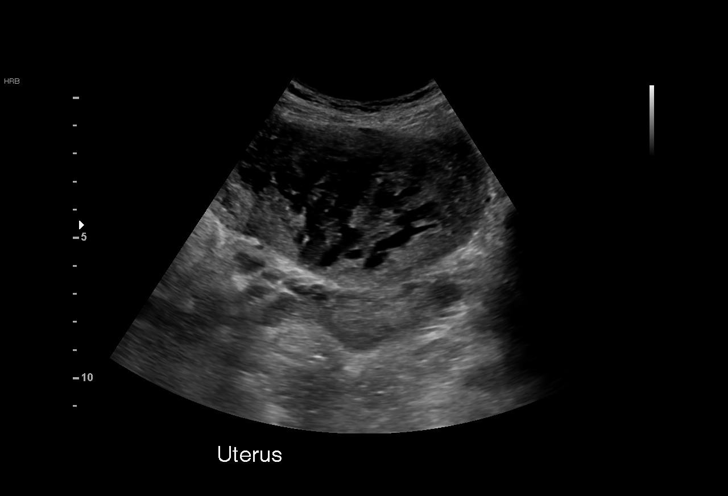
[im 23/36]
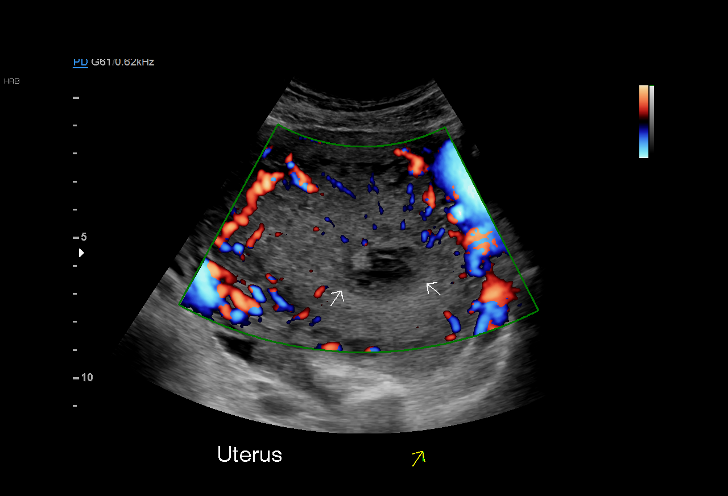
[im 25/36]
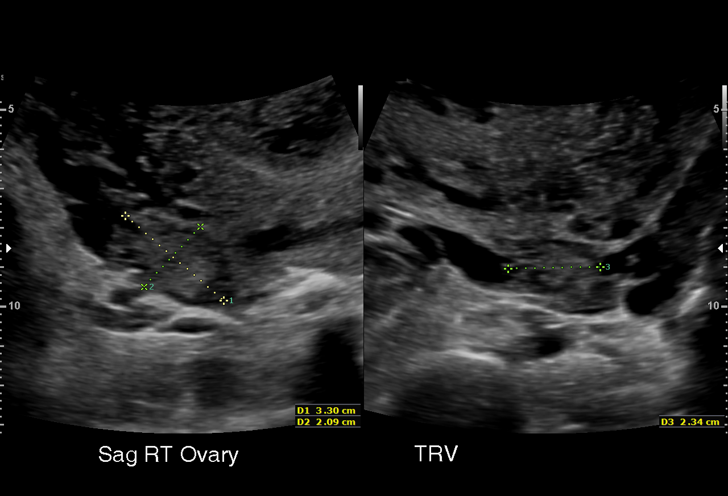
[im 28/36]
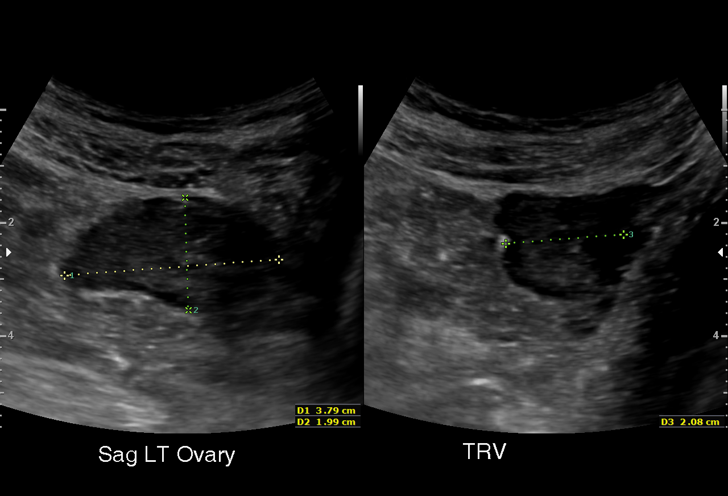
[im 30/36]
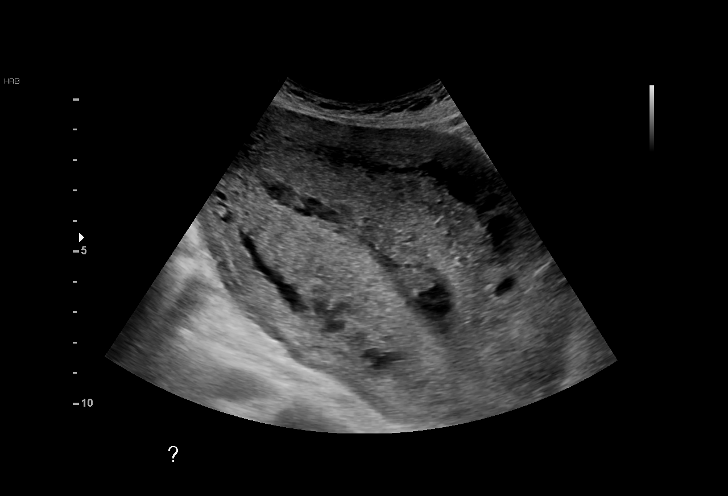
[im 33/36]
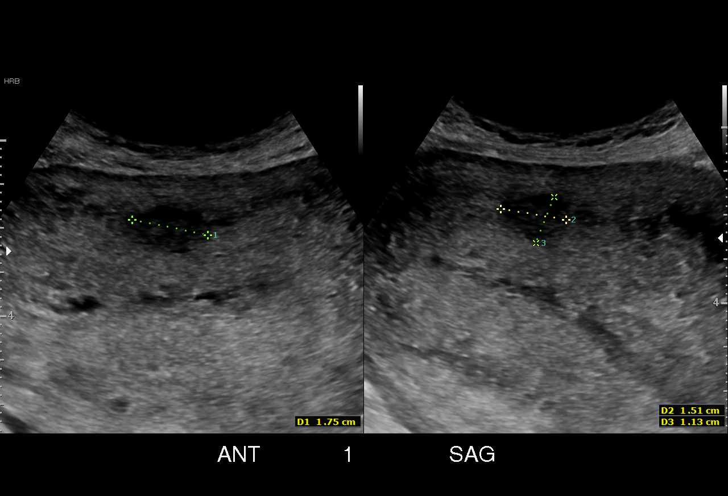
[im 36/36]
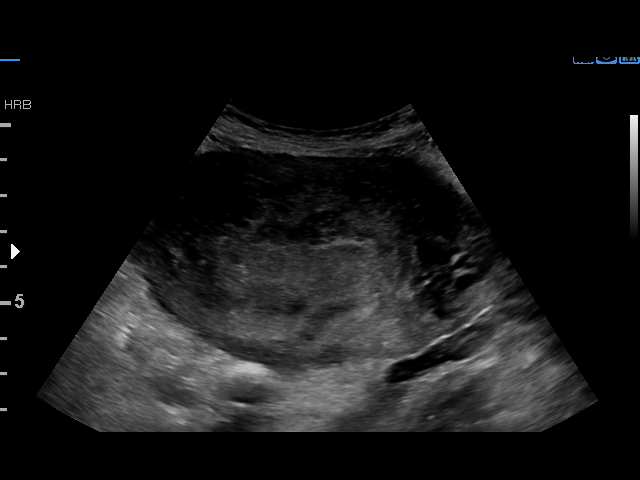

[15 of 28 positions shown; findings below may reference images not displayed]

FINDINGS: Intrauterine gestational sac: None visualized

Yolk sac:  None

Embryo:  None

Cardiac Activity:

Heart Rate:  bpm

MSD:    mm    w     d

CRL:     mm    w  d                  US EDC:

Subchorionic hemorrhage:  None visualized.

Maternal uterus/adnexae: Endometrium approximately 15 mm in
thickness, heterogeneous. There appears to be some vascularity
within the endometrium. Findings concerning for retained products of
conception.
IMPRESSION: Heterogeneous endometrium with areas of vascularity concerning for
retained products of conception.
